# Patient Record
Sex: Male | Born: 1975 | Race: White | Hispanic: No | Smoking: Current every day smoker
Health system: Southern US, Community
[De-identification: ages and names within clinical notes are randomized; demographics above are authoritative.]

## PROBLEM LIST (undated history)

## (undated) DIAGNOSIS — IMO0002 Reserved for concepts with insufficient information to code with codable children: Secondary | ICD-10-CM

## (undated) DIAGNOSIS — M5030 Other cervical disc degeneration, unspecified cervical region: Secondary | ICD-10-CM

## (undated) DIAGNOSIS — N200 Calculus of kidney: Secondary | ICD-10-CM

---

## 2011-06-06 ENCOUNTER — Emergency Department (HOSPITAL_COMMUNITY)
Admission: EM | Admit: 2011-06-06 | Discharge: 2011-06-06 | Disposition: A | Discharge status: Home / Self Care | Payer: Medicaid - Out of State | Attending: Emergency Medicine | Admitting: Emergency Medicine

## 2011-06-06 ENCOUNTER — Encounter: Payer: Self-pay | Admitting: *Deleted

## 2011-06-06 DIAGNOSIS — M5030 Other cervical disc degeneration, unspecified cervical region: Secondary | ICD-10-CM | POA: Insufficient documentation

## 2011-06-06 DIAGNOSIS — R109 Unspecified abdominal pain: Secondary | ICD-10-CM

## 2011-06-06 DIAGNOSIS — F17200 Nicotine dependence, unspecified, uncomplicated: Secondary | ICD-10-CM | POA: Insufficient documentation

## 2011-06-06 DIAGNOSIS — N433 Hydrocele, unspecified: Secondary | ICD-10-CM | POA: Insufficient documentation

## 2011-06-06 DIAGNOSIS — I861 Scrotal varices: Secondary | ICD-10-CM | POA: Insufficient documentation

## 2011-06-06 DIAGNOSIS — N50811 Right testicular pain: Secondary | ICD-10-CM

## 2011-06-06 DIAGNOSIS — N509 Disorder of male genital organs, unspecified: Secondary | ICD-10-CM | POA: Insufficient documentation

## 2011-06-06 HISTORY — DX: Other cervical disc degeneration, unspecified cervical region: M50.30

## 2011-06-06 LAB — CBC
HCT: 45.1 % (ref 39.0–52.0)
MCH: 30.5 pg (ref 26.0–34.0)
MCHC: 33.9 g/dL (ref 30.0–36.0)
MCV: 89.8 fL (ref 78.0–100.0)
RDW: 12.3 % (ref 11.5–15.5)

## 2011-06-06 LAB — BASIC METABOLIC PANEL
BUN: 11 mg/dL (ref 6–23)
Creatinine, Ser: 0.79 mg/dL (ref 0.50–1.35)
GFR calc Af Amer: >90 mL/min (ref >90)
GFR calc non Af Amer: >90 mL/min (ref >90)

## 2011-06-06 LAB — URINALYSIS, ROUTINE W REFLEX MICROSCOPIC
Bilirubin Urine: NEGATIVE
Ketones, ur: NEGATIVE mg/dL
Nitrite: NEGATIVE
Urobilinogen, UA: 0.2 mg/dL (ref 0.0–1.0)

## 2011-06-06 LAB — URINE MICROSCOPIC-ADD ON

## 2011-06-06 MED ORDER — HYDROMORPHONE HCL 1 MG/ML IJ SOLN
1.0000 mg | Freq: Once | INTRAMUSCULAR | Status: AC
  Administered 2011-06-06: 1 mg via INTRAVENOUS
  Filled 2011-06-06: qty 1

## 2011-06-06 MED ORDER — ONDANSETRON HCL 4 MG/2ML IJ SOLN
4.0000 mg | Freq: Once | INTRAMUSCULAR | Status: AC
  Administered 2011-06-06: 4 mg via INTRAVENOUS
  Filled 2011-06-06: qty 2

## 2011-06-06 NOTE — ED Notes (Signed)
Pt c/o pain to groin, right lower back, and right leg. Pt states he has been hurting for aprox 2 weeks. Pt denies injury. Pt states he was seen and treated at [facility] with no relief.

## 2011-06-06 NOTE — ED Provider Notes (Addendum)
History     CSN: [id number] Arrival date & time: 06/06/2011 10:24 AM   First MD Initiated Contact with Patient 06/06/11 1038      Chief Complaint  Patient presents with  . Groin Pain    (Consider location/radiation/quality/duration/timing/severity/associated sxs/prior treatment) HPI Comments: Recently seen at [facility] for same complaints.  He reports normal CT abd/pelvis and normal testicular u/s.  Patient is a 35 y.o. male presenting with groin pain. The history is provided by the patient. No language interpreter was used.  Groin Pain This is a new problem. Episode onset: ~ 2 weeks ago. The problem occurs constantly. The problem has been unchanged. Associated symptoms include urinary symptoms. Pertinent negatives include no fever, nausea or vomiting. The symptoms are aggravated by nothing. He has tried NSAIDs for the symptoms. The treatment provided mild relief.    Past Medical History  Diagnosis Date  . Degenerative cervical disc     History reviewed. No pertinent past surgical history.  History reviewed. No pertinent family history.  History  Substance Use Topics  . Smoking status: Current Everyday Smoker -- 1.0 packs/day  . Smokeless tobacco: Not on file  . Alcohol Use: No      Review of Systems  Constitutional: Negative for fever.  Gastrointestinal: Negative for nausea and vomiting.  Genitourinary: Positive for dysuria, flank pain and testicular pain. Negative for urgency, frequency, hematuria, discharge, penile swelling, difficulty urinating and penile pain.  All other systems reviewed and are negative.    Allergies  Bee venom  Home Medications   Current Outpatient Rx  Name Route Sig Dispense Refill  . DIAZEPAM 10 MG PO TABS Oral Take 10 mg by mouth 2 (two) times daily as needed. FOR ANXIETY OR SLEEP     . OXYCODONE HCL 15 MG PO TABS Oral Take 15 mg by mouth 3 (three) times daily as needed. FOR BREAK THROUGH PAIN     . OXYCODONE HCL 30 MG PO TABS Oral  Take 30 mg by mouth 4 (four) times daily.        BP 119/90  Temp(Src) 97.8 F (36.6 C) (Oral)  Resp 18  Ht 5\' 9"  (1.753 m)  Wt 145 lb (65.772 kg)  BMI 21.41 kg/m2  SpO2 99%  Physical Exam  Nursing note and vitals reviewed. Constitutional: He is oriented to person, place, and time. He appears well-developed and well-nourished. No distress.  HENT:  Head: Normocephalic and atraumatic.  Eyes: EOM are normal.  Neck: Normal range of motion.  Cardiovascular: Normal rate, regular rhythm, normal heart sounds and intact distal pulses.   Pulmonary/Chest: Effort normal and breath sounds normal. No respiratory distress.  Abdominal: Soft. Normal appearance and bowel sounds are normal. He exhibits no distension. There is no tenderness. There is no rigidity, no rebound, no guarding and no CVA tenderness. Hernia confirmed negative in the right inguinal area and confirmed negative in the left inguinal area.    Genitourinary: Testes normal and penis normal.    Right testis shows no mass, no swelling and no tenderness. Right testis is descended. Left testis shows no mass, no swelling and no tenderness. Left testis is descended. Circumcised.  Musculoskeletal: Normal range of motion.  Lymphadenopathy:       Right: No inguinal adenopathy present.       Left: No inguinal adenopathy present.  Neurological: He is alert and oriented to person, place, and time.  Skin: Skin is warm and dry. He is not diaphoretic.  Psychiatric: He has a normal mood and  affect. Judgment normal.    ED Course  Procedures (including critical care time)   Labs Reviewed  CBC  BASIC METABOLIC PANEL  URINALYSIS, ROUTINE W REFLEX MICROSCOPIC   No results found.   No diagnosis found.    MDM  i have reviewed the CT of abdomen and pelvis and the testicular u/s studies done at [facility] hospital.  There were no signs of any renal or kidney stones.  The testicular ultrasound showed no evidence of torsion.  You have small  bilateral hydroceles and small L varicocele.  No epididymitis or orchitis.        Worthy Rancher, PA 06/06/11 1432  Worthy Rancher, PA 09/18/11 [phone]

## 2011-06-08 NOTE — ED Provider Notes (Signed)
Medical screening examination/treatment/procedure(s) were performed by non-physician practitioner and as supervising physician I was immediately available for consultation/collaboration.   Benny Lennert, MD 06/08/11 [phone]

## 2011-09-18 NOTE — ED Provider Notes (Signed)
Medical screening examination/treatment/procedure(s) were performed by non-physician practitioner and as supervising physician I was immediately available for consultation/collaboration.   Benny Lennert, MD 09/18/11 [phone]

## 2012-03-30 ENCOUNTER — Emergency Department (HOSPITAL_COMMUNITY): Payer: Self-pay

## 2012-03-30 ENCOUNTER — Emergency Department (HOSPITAL_COMMUNITY)
Admission: EM | Admit: 2012-03-30 | Discharge: 2012-03-30 | Disposition: A | Discharge status: Home / Self Care | Payer: Self-pay | Attending: Emergency Medicine | Admitting: Emergency Medicine

## 2012-03-30 ENCOUNTER — Encounter (HOSPITAL_COMMUNITY): Payer: Self-pay | Admitting: *Deleted

## 2012-03-30 DIAGNOSIS — W268XXA Contact with other sharp object(s), not elsewhere classified, initial encounter: Secondary | ICD-10-CM | POA: Insufficient documentation

## 2012-03-30 DIAGNOSIS — S61209A Unspecified open wound of unspecified finger without damage to nail, initial encounter: Secondary | ICD-10-CM | POA: Insufficient documentation

## 2012-03-30 DIAGNOSIS — M79609 Pain in unspecified limb: Secondary | ICD-10-CM | POA: Insufficient documentation

## 2012-03-30 DIAGNOSIS — S61219A Laceration without foreign body of unspecified finger without damage to nail, initial encounter: Secondary | ICD-10-CM

## 2012-03-30 HISTORY — DX: Reserved for concepts with insufficient information to code with codable children: IMO0002

## 2012-03-30 MED ORDER — CEPHALEXIN 500 MG PO CAPS: 500.0000 mg | ORAL_CAPSULE | Freq: Four times a day (QID) | ORAL | Status: AC

## 2012-03-30 MED ORDER — TETANUS-DIPHTH-ACELL PERTUSSIS 5-2.5-18.5 LF-MCG/0.5 IM SUSP: 0.5000 mL | Freq: Once | INTRAMUSCULAR | Status: DC

## 2012-03-30 MED ORDER — IBUPROFEN 800 MG PO TABS: 800.0000 mg | ORAL_TABLET | Freq: Three times a day (TID) | ORAL | Status: AC

## 2012-03-30 MED ORDER — BACITRACIN ZINC 500 UNIT/GM EX OINT
TOPICAL_OINTMENT | CUTANEOUS | Status: AC
  Administered 2012-03-30: 1
  Filled 2012-03-30: qty 0.9

## 2012-03-30 MED ORDER — LIDOCAINE HCL (PF) 2 % IJ SOLN
INTRAMUSCULAR | Status: AC
  Administered 2012-03-30: 02:00:00
  Filled 2012-03-30: qty 10

## 2012-03-30 MED ORDER — HYDROCODONE-ACETAMINOPHEN 7.5-500 MG/15ML PO SOLN: 7.5000 mL | Freq: Four times a day (QID) | ORAL | Status: AC | PRN

## 2012-03-30 NOTE — ED Notes (Signed)
Finger soaking in NS and betadine solution. Prior to being cleaned and covered with gauze dressing.

## 2012-03-30 NOTE — ED Notes (Signed)
Pt cut top R middle finger 2 days ago on a piece of metal Pt reports he has had tenus shot 4 months ago

## 2012-03-30 NOTE — ED Provider Notes (Signed)
History     CSN: [id number]  Arrival date & time 03/30/12  0023   First MD Initiated Contact with Patient 03/30/12 0034      Chief Complaint  Patient presents with  . Extremity Laceration    (Consider location/radiation/quality/duration/timing/severity/associated sxs/prior treatment) HPI Hx per PT. Injury to R 3rd finger 2 nights ago, sustained laceration to pad of finger while throwing a metal lamp.  Is here with sig other who is being evaluated.  He is having ongoing pain and would like to be evaluated.  Last tetanus shot 4 months ago. No FB sensation, hurts to bend his finger. No weakness or numbness. Pain is sharp and not radiating. Mod in severity.  Past Medical History  Diagnosis Date  . Degenerative cervical disc   . Degenerative disk disease     6 hernia disk    History reviewed. No pertinent past surgical history.  History reviewed. No pertinent family history.  History  Substance Use Topics  . Smoking status: Current Everyday Smoker -- 1.0 packs/day  . Smokeless tobacco: Not on file  . Alcohol Use: No      Review of Systems  Skin: Positive for wound.  All other systems reviewed and are negative.    Allergies  Bee venom  Home Medications   Current Outpatient Rx  Name Route Sig Dispense Refill  . TRAMADOL HCL 50 MG PO TABS Oral Take 50 mg by mouth every 6 (six) hours as needed.    Marland Kitchen DIAZEPAM 10 MG PO TABS Oral Take 10 mg by mouth 2 (two) times daily as needed. FOR ANXIETY OR SLEEP     . OXYCODONE HCL 15 MG PO TABS Oral Take 15 mg by mouth 3 (three) times daily as needed. FOR BREAK THROUGH PAIN     . OXYCODONE HCL 30 MG PO TABS Oral Take 30 mg by mouth 4 (four) times daily.        BP 105/71  Pulse 83  Temp 98.8 F (37.1 C) (Oral)  Resp 18  Ht 5\' 9"  (1.753 m)  Wt 155 lb (70.308 kg)  BMI 22.89 kg/m2  SpO2 95%  Physical Exam  Constitutional: He is oriented to person, place, and time. He appears well-developed and well-nourished.  HENT:  Head:  Normocephalic and atraumatic.  Eyes: EOM are normal. Pupils are equal, round, and reactive to light.  Neck: Neck supple.  Cardiovascular: Normal rate, regular rhythm and intact distal pulses.   Pulmonary/Chest: Effort normal. No respiratory distress.  Musculoskeletal:       R 3rd digit with jagged laceration to pad of distal finger. Wound has started healing, no acvtive bleeding TTP without any inc warmth, fluctuance or surrounding erythema, no palpable FB, distal cap refill intact. Distal n/v intact. No FA flexor surface tenderness. No stareking lymphangitis.   Neurological: He is alert and oriented to person, place, and time. No cranial nerve deficit.  Skin: Skin is warm and dry. No rash noted.    ED Course  NERVE BLOCK Date/Time: 03/30/2012 2:30 AM Performed by: Sunnie Nielsen Authorized by: Sunnie Nielsen Consent: Verbal consent obtained. Risks and benefits: risks, benefits and alternatives were discussed Consent given by: patient Patient understanding: patient states understanding of the procedure being performed Patient consent: the patient's understanding of the procedure matches consent given Procedure consent: procedure consent matches procedure scheduled Required items: required blood products, implants, devices, and special equipment available Patient identity confirmed: verbally with patient Time out: Immediately prior to procedure a "time out" was called  to verify the correct patient, procedure, equipment, support staff and site/side marked as required. Indications: pain relief Body area: upper extremity Nerve: digital Laterality: right Preparation: Patient was prepped and draped in the usual sterile fashion. Patient position: sitting Needle gauge: 25 G Location technique: anatomical landmarks Local anesthetic: lidocaine 1% without epinephrine Anesthetic total: 2 ml Outcome: pain improved Patient tolerance: Patient tolerated the procedure well with no immediate  complications.   (including critical care time)  Labs Reviewed - No data to display Dg Finger Middle Right  03/30/2012  *RADIOLOGY REPORT*  Clinical Data: Swelling and pain after laceration to the third digit 2 days ago.  RIGHT MIDDLE FINGER 2+V  Comparison: None.  Findings: The right third finger appears intact. No evidence of acute fracture or subluxation.  No focal bone lesions.  Bone matrix and cortex appear intact.  No abnormal radiopaque densities in the soft tissues.  IMPRESSION: No acute bony abnormalities.  No radiopaque soft tissue foreign bodies.  Original Report Authenticated By: Marlon Pel, M.D.   Wound care provided, wound cleansed thoroughly, is beyond timeframe appropriate for primary closure. Is healing by secondary intention. Plan ABx, pain meds and Hand follow up with referral provided. Infx precautiojns verbalized as understood.   MDM   nursing notes and VS reviewed. Imaging as above no FB. Digital block pain resolved. Wound care provided.         Sunnie Nielsen, MD 03/30/12 [phone]

## 2012-03-30 NOTE — ED Notes (Signed)
Bacitracin ointment and gauze dressing applied per MD instructions.

## 2012-03-30 NOTE — ED Notes (Signed)
Pt reports laceration on finger was sustained 2 days ago, states that since that time he has been putting liquid bandage on cut. Reporting throbbing pain.

## 2015-03-02 ENCOUNTER — Encounter (HOSPITAL_COMMUNITY): Payer: Self-pay | Admitting: Emergency Medicine

## 2015-03-02 ENCOUNTER — Emergency Department (HOSPITAL_COMMUNITY): Payer: Medicaid - Out of State

## 2015-03-02 ENCOUNTER — Emergency Department (HOSPITAL_COMMUNITY)
Admission: EM | Admit: 2015-03-02 | Discharge: 2015-03-02 | Disposition: A | Discharge status: Home / Self Care | Payer: Medicaid - Out of State | Attending: Emergency Medicine | Admitting: Emergency Medicine

## 2015-03-02 DIAGNOSIS — Z720 Tobacco use: Secondary | ICD-10-CM | POA: Insufficient documentation

## 2015-03-02 DIAGNOSIS — Z8739 Personal history of other diseases of the musculoskeletal system and connective tissue: Secondary | ICD-10-CM | POA: Insufficient documentation

## 2015-03-02 DIAGNOSIS — R300 Dysuria: Secondary | ICD-10-CM | POA: Insufficient documentation

## 2015-03-02 DIAGNOSIS — R109 Unspecified abdominal pain: Secondary | ICD-10-CM | POA: Insufficient documentation

## 2015-03-02 DIAGNOSIS — Z87442 Personal history of urinary calculi: Secondary | ICD-10-CM | POA: Insufficient documentation

## 2015-03-02 HISTORY — DX: Calculus of kidney: N20.0

## 2015-03-02 LAB — URINALYSIS, ROUTINE W REFLEX MICROSCOPIC
Bilirubin Urine: NEGATIVE
Glucose, UA: NEGATIVE mg/dL
HGB URINE DIPSTICK: NEGATIVE
KETONES UR: NEGATIVE mg/dL
Leukocytes, UA: NEGATIVE
Nitrite: NEGATIVE
PH: 6 (ref 5.0–8.0)
PROTEIN: NEGATIVE mg/dL
Specific Gravity, Urine: >1.03 — ABNORMAL HIGH (ref 1.005–1.030)
Urobilinogen, UA: 0.2 mg/dL (ref 0.0–1.0)

## 2015-03-02 MED ORDER — KETOROLAC TROMETHAMINE 30 MG/ML IJ SOLN
30.0000 mg | Freq: Once | INTRAMUSCULAR | Status: AC
  Administered 2015-03-02: 30 mg via INTRAVENOUS
  Filled 2015-03-02: qty 1

## 2015-03-02 NOTE — ED Provider Notes (Signed)
CSN: [id number]     Arrival date & time 03/02/15  1643 History   First MD Initiated Contact with Patient 03/02/15 1703     Chief Complaint  Patient presents with  . Flank Pain     (Consider location/radiation/quality/duration/timing/severity/associated sxs/prior Treatment) Patient is a 39 y.o. male presenting with flank pain. The history is provided by the patient.  Flank Pain Pertinent negatives include no chest pain, no abdominal pain, no headaches and no shortness of breath.   patient presents with right flank pain over the last few days. States it started sort of in the flank and has worked its way down to the groin now. Has had some urinary symptoms. No fevers. No cough. Pain is worse with movements. States he had history of kidney stones but it was 10 years ago. No nausea or vomiting. States he has taken a little Motrin for it. Denies other pain meds. States he has chronic neck pain.  Past Medical History  Diagnosis Date  . Degenerative cervical disc   . Degenerative disk disease     6 hernia disk  . Kidney stone    History reviewed. No pertinent past surgical history. History reviewed. No pertinent family history. History  Substance Use Topics  . Smoking status: Current Every Day Smoker -- 1.00 packs/day    Types: Cigarettes  . Smokeless tobacco: Not on file  . Alcohol Use: No    Review of Systems  Constitutional: Negative for activity change and appetite change.  Eyes: Negative for pain.  Respiratory: Negative for chest tightness and shortness of breath.   Cardiovascular: Negative for chest pain and leg swelling.  Gastrointestinal: Negative for nausea, vomiting, abdominal pain and diarrhea.  Genitourinary: Positive for dysuria and flank pain.  Musculoskeletal: Negative for back pain and neck stiffness.  Skin: Negative for rash.  Neurological: Negative for weakness, numbness and headaches.  Psychiatric/Behavioral: Negative for behavioral problems.      Allergies   Bee venom  Home Medications   Prior to Admission medications   Medication Sig Start Date End Date Taking? Authorizing Provider  ibuprofen (ADVIL,MOTRIN) 200 MG tablet Take 200-600 mg by mouth every 6 (six) hours as needed for mild pain or moderate pain.   Yes Historical Provider, MD   BP 119/88 mmHg  Pulse 60  Temp(Src) 98.4 F (36.9 C) (Oral)  Resp 16  Ht 5\' 9"  (1.753 m)  Wt 160 lb (72.576 kg)  BMI 23.62 kg/m2  SpO2 94% Physical Exam  Constitutional: He is oriented to person, place, and time. He appears well-developed and well-nourished.  HENT:  Head: Normocephalic and atraumatic.  Eyes: Pupils are equal, round, and reactive to light.  Neck: Normal range of motion. Neck supple.  Cardiovascular: Normal rate, regular rhythm and normal heart sounds.   No murmur heard. Pulmonary/Chest: Effort normal and breath sounds normal.  Abdominal: Soft. Bowel sounds are normal. He exhibits no distension. There is no tenderness.  Genitourinary:  Mild CVA tenderness on right. No abdominal tenderness.  Musculoskeletal: Normal range of motion. He exhibits no edema.  Neurological: He is alert and oriented to person, place, and time. No cranial nerve deficit.  Skin: Skin is warm and dry.  Nursing note and vitals reviewed.   ED Course  Procedures (including critical care time) Labs Review Labs Reviewed  URINALYSIS, ROUTINE W REFLEX MICROSCOPIC (NOT AT [facility]) - Abnormal; Notable for the following:    Specific Gravity, Urine >1.030 (*)    All other components within normal limits  Imaging Review Ct Renal Stone Study  03/02/2015   CLINICAL DATA:  Right-sided flank pain for 4 days  EXAM: CT ABDOMEN AND PELVIS WITHOUT CONTRAST  TECHNIQUE: Multidetector CT imaging of the abdomen and pelvis was performed following the standard protocol without IV contrast.  COMPARISON:  None.  FINDINGS: Minimal left basilar atelectasis is noted.  The liver, gallbladder, spleen, adrenal glands and pancreas are  all normal in their CT appearance. The kidneys are well visualized bilaterally and reveal no renal calculi. No obstructive changes are seen. The bladder is well distended. Prostatic calcifications are noted. No pelvic mass lesion or sidewall abnormality is seen. The appendix is within normal limits. No acute bony abnormality is seen.  IMPRESSION: Minimal left basilar atelectasis.  No other focal abnormality is seen.   Electronically Signed   By: Alcide Clever M.D.   On: 03/02/2015 18:02     EKG Interpretation None      MDM   Final diagnoses:  Right flank pain    Patient with flank pain. Urinalysis and CT reassuring. Patient denied being on any other pain medicines but review of the controlled substance database showed he is on oxycodone 30s 5 times a day. He*pain medicines for home I said will use still heavy oxycodone start you he said no that he stopped that a while ago. I told him that he got 30 day supply under a month ago. He said will that was from a different doctor and it was for my neck. This point he'll not be given any narcotics with negative CT scan can and negative urinalysis and will be discharged home.    Benjiman Core, MD 03/02/15 [phone]

## 2015-03-02 NOTE — ED Notes (Signed)
Pt states that he has been having right sided flank pain for the past few days.

## 2015-03-02 NOTE — Discharge Instructions (Signed)
Flank Pain °Flank pain refers to pain that is located on the side of the body between the upper abdomen and the back. The pain may occur over a short period of time (acute) or may be long-term or reoccurring (chronic). It may be mild or severe. Flank pain can be caused by many things. °CAUSES  °Some of the more common causes of flank pain include: °· Muscle strains.   °· Muscle spasms.   °· A disease of your spine (vertebral disk disease).   °· A lung infection (pneumonia).   °· Fluid around your lungs (pulmonary edema).   °· A kidney infection.   °· Kidney stones.   °· A very painful skin rash caused by the chickenpox virus (shingles).   °· Gallbladder disease.   °HOME CARE INSTRUCTIONS  °Home care will depend on the cause of your pain. In general, °· Rest as directed by your caregiver. °· Drink enough fluids to keep your urine clear or pale yellow. °· Only take over-the-counter or prescription medicines as directed by your caregiver. Some medicines may help relieve the pain. °· Tell your caregiver about any changes in your pain. °· Follow up with your caregiver as directed. °SEEK IMMEDIATE MEDICAL CARE IF:  °· Your pain is not controlled with medicine.   °· You have new or worsening symptoms. °· Your pain increases.   °· You have abdominal pain.   °· You have shortness of breath.   °· You have persistent nausea or vomiting.   °· You have swelling in your abdomen.   °· You feel faint or pass out.   °· You have blood in your urine. °· You have a fever or persistent symptoms for more than 2-3 days. °· You have a fever and your symptoms suddenly get worse. °MAKE SURE YOU:  °· Understand these instructions. °· Will watch your condition. °· Will get help right away if you are not doing well or get worse. °Document Released: 09/20/2005 Document Revised: 04/23/2012 Document Reviewed: 03/13/2012 °[employer]® Patient Information ©2015 [employer]. This information is not intended to replace advice given to you by your  health care provider. Make sure you discuss any questions you have with your health care provider. ° °

## 2016-03-02 IMAGING — CT CT RENAL STONE PROTOCOL
3 of 4 series · 9 of 46 positions shown, 16 images · non-contrast
Comparison: None.

CLINICAL DATA: Right-sided flank pain for 4 days

EXAM:
CT ABDOMEN AND PELVIS WITHOUT CONTRAST
TECHNIQUE: Multidetector CT imaging of the abdomen and pelvis was performed
following the standard protocol without IV contrast.

[Series 3: lung 5.0 b60f · axial · 0.61mm/px · z∈[+980,+1060]mm · 5 of 25 slices shown, 10 images]
[im 5/25  soft-tissue]
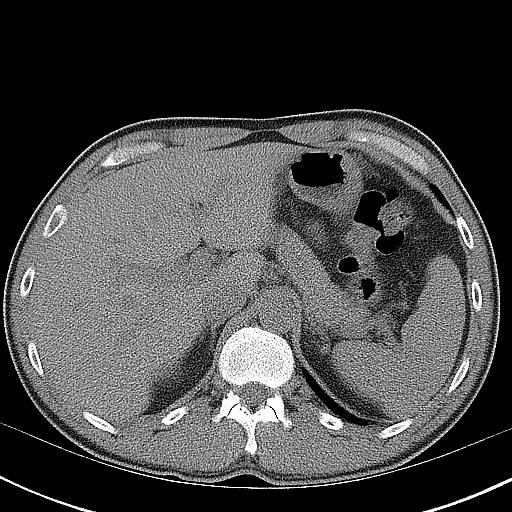
[im 5/25  bone]
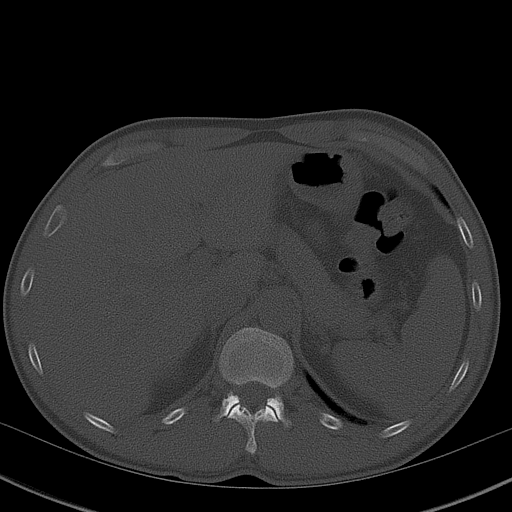
[im 9/25  soft-tissue]
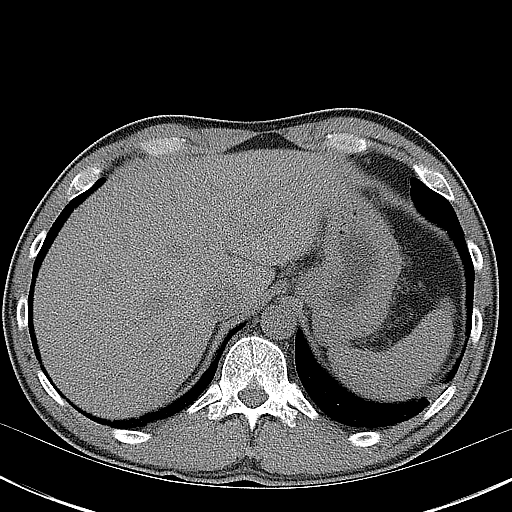
[im 9/25  lung]
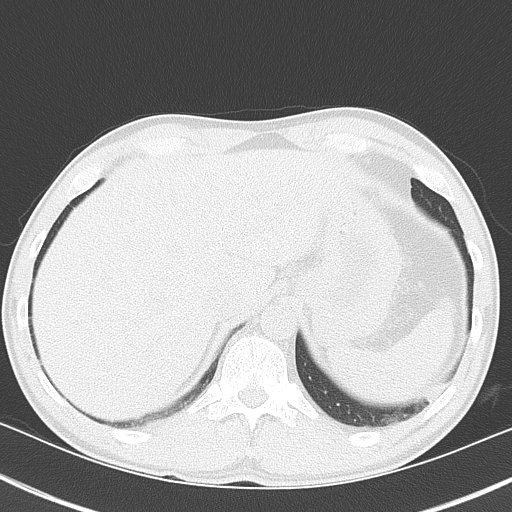
[im 13/25  soft-tissue]
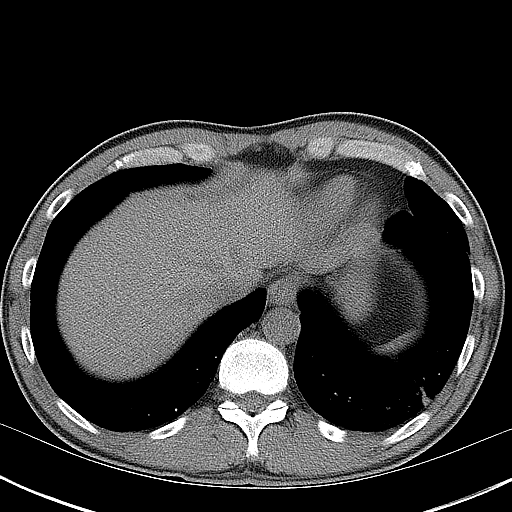
[im 13/25  lung]
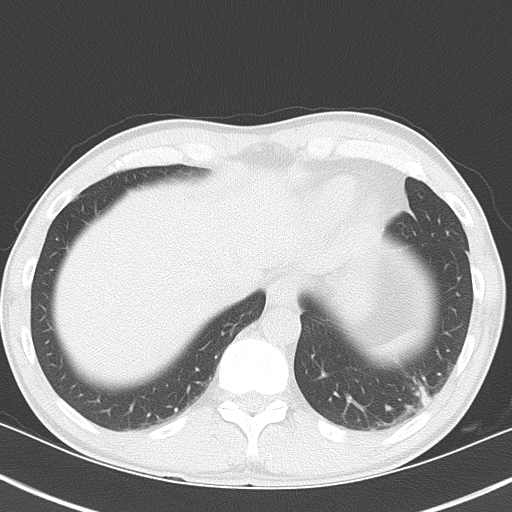
[im 17/25  soft-tissue]
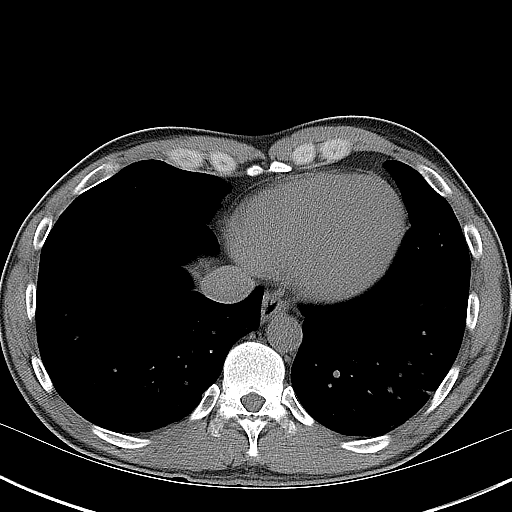
[im 17/25  lung]
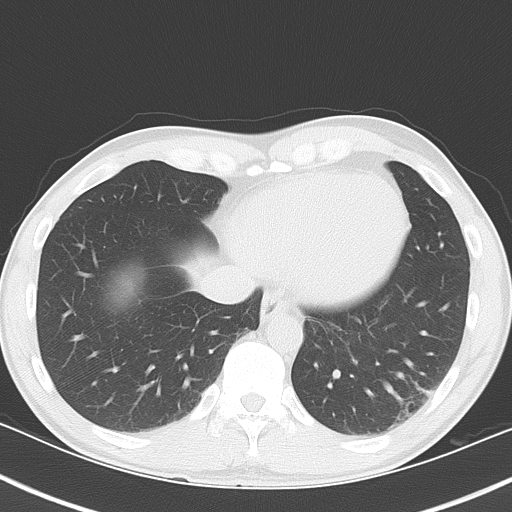
[im 21/25  soft-tissue]
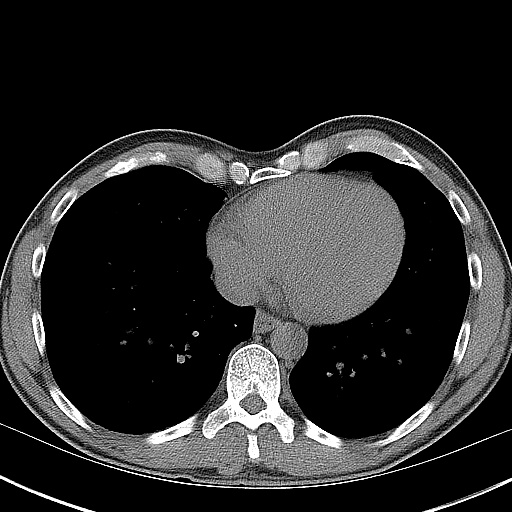
[im 21/25  lung]
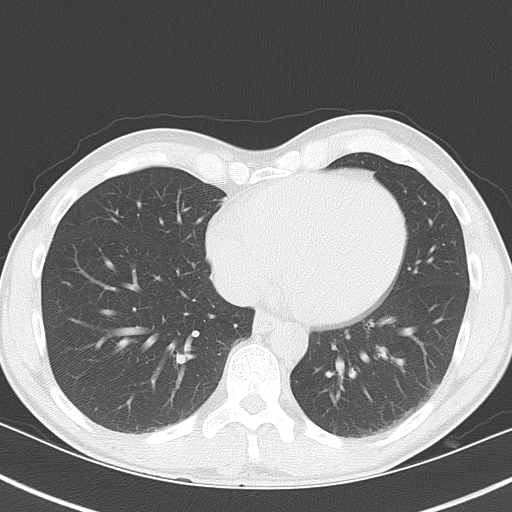

[Series 4: mpr coronal 3.0mm · coronal · 0.65mm/px · 3 of 76 slices shown, 4 images]
[im 26/76  soft-tissue]
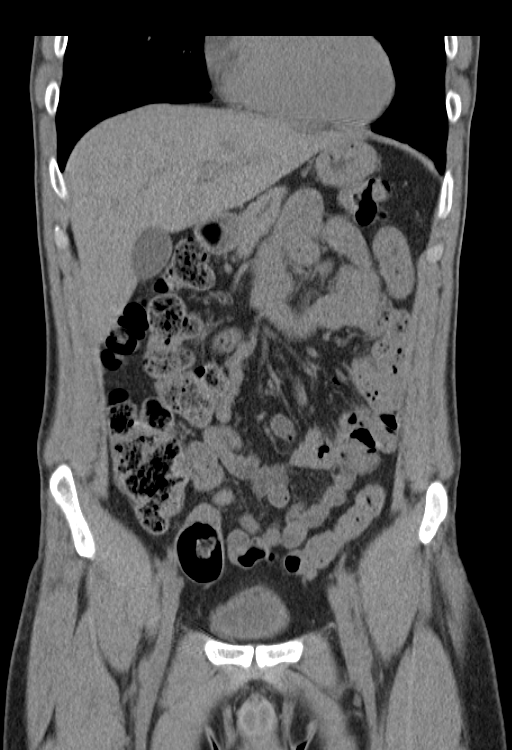
[im 34/76  soft-tissue]
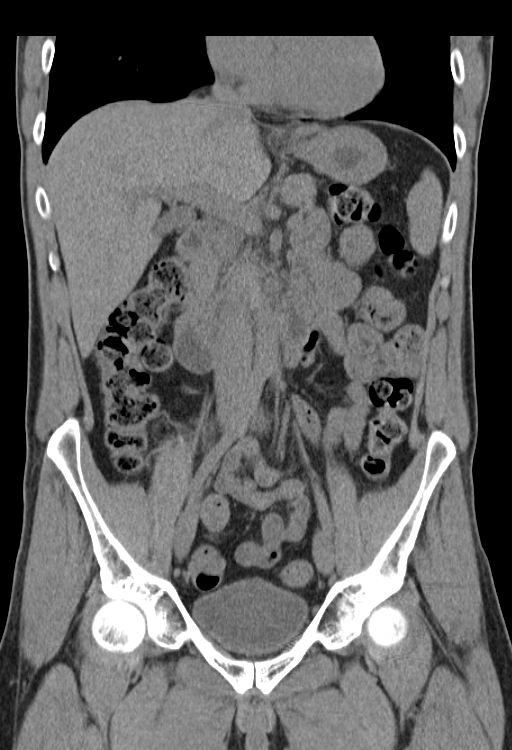
[im 34/76  bone]
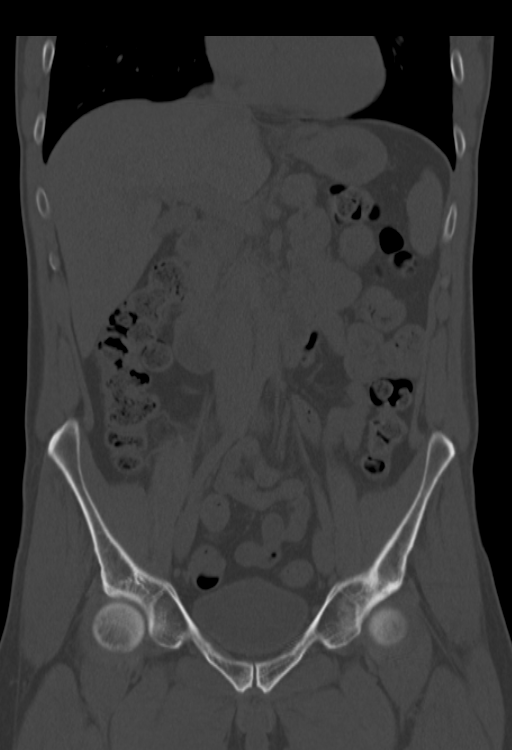
[im 42/76  soft-tissue]
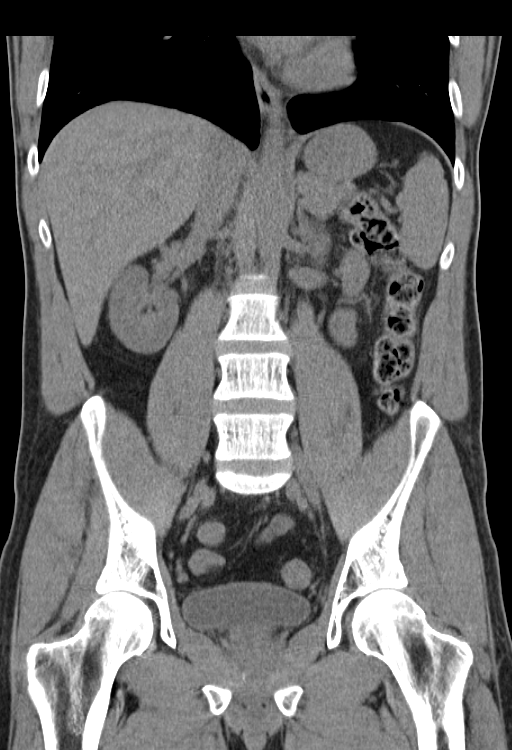

[Series 5: mpr sagittal 3.0mm · sagittal · 0.44mm/px · 1 of 103 slices shown, 2 images]
[im 35/103  soft-tissue]
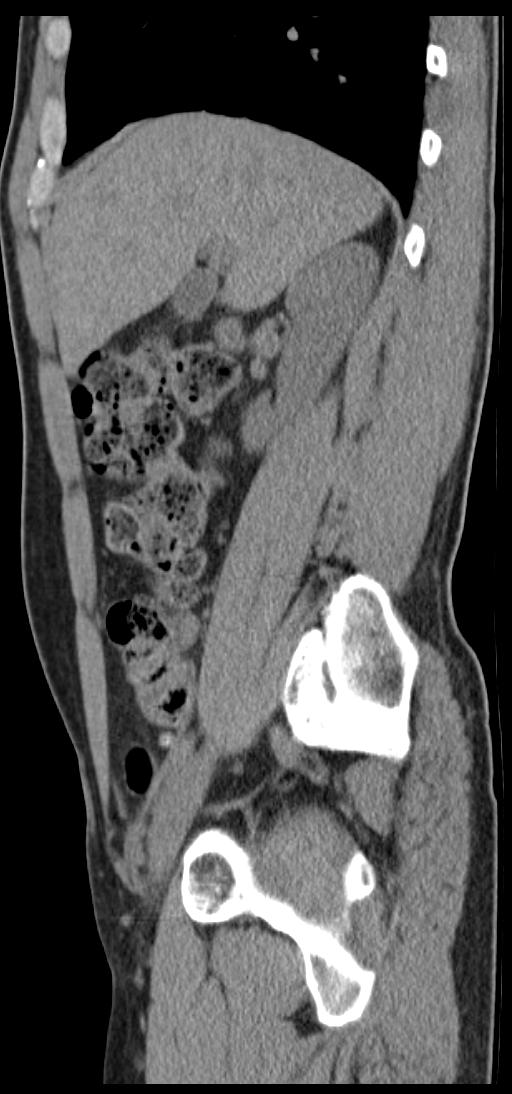
[im 35/103  bone]
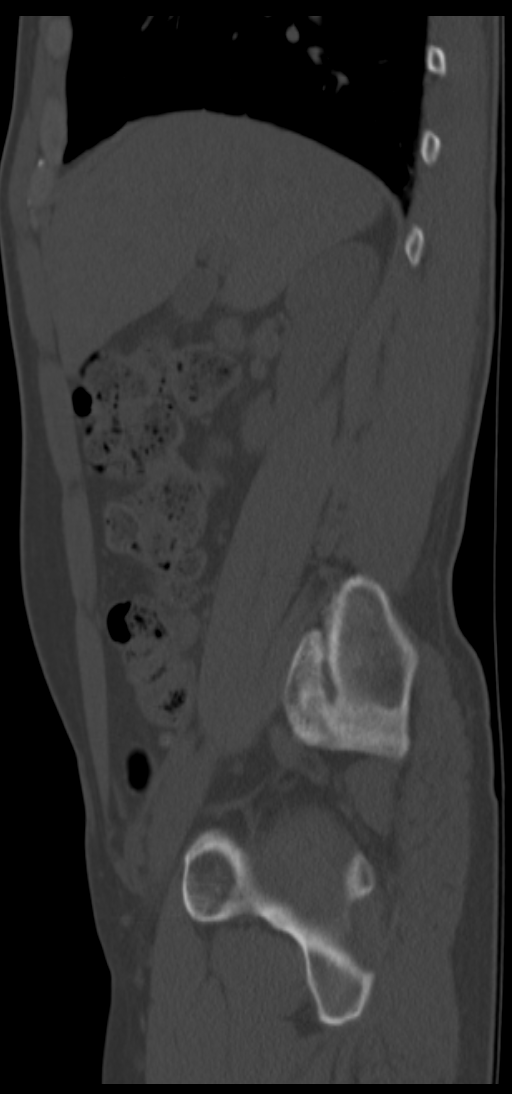

[9 of 46 positions shown; findings below may reference images not displayed]

FINDINGS: Minimal left basilar atelectasis is noted.

The liver, gallbladder, spleen, adrenal glands and pancreas are all
normal in their CT appearance. The kidneys are well visualized
bilaterally and reveal no renal calculi. No obstructive changes are
seen. The bladder is well distended. Prostatic calcifications are
noted. No pelvic mass lesion or sidewall abnormality is seen. The
appendix is within normal limits. No acute bony abnormality is seen.
IMPRESSION: Minimal left basilar atelectasis.

No other focal abnormality is seen.
